# Patient Record
Sex: Female | Born: 1990
Health system: Southern US, Community
[De-identification: ages and names within clinical notes are randomized; demographics above are authoritative.]

## PROBLEM LIST (undated history)

## (undated) ENCOUNTER — Inpatient Hospital Stay (HOSPITAL_COMMUNITY): Payer: Self-pay

## (undated) DIAGNOSIS — F419 Anxiety disorder, unspecified: Secondary | ICD-10-CM

## (undated) HISTORY — PX: APPENDECTOMY: SHX54

---

## 2017-10-18 ENCOUNTER — Other Ambulatory Visit: Payer: Self-pay

## 2017-10-18 ENCOUNTER — Emergency Department (HOSPITAL_COMMUNITY)
Admission: EM | Admit: 2017-10-18 | Discharge: 2017-10-18 | Disposition: A | Discharge status: Home / Self Care | Payer: Medicaid Other | Attending: Emergency Medicine | Admitting: Emergency Medicine

## 2017-10-18 ENCOUNTER — Encounter (HOSPITAL_COMMUNITY): Payer: Self-pay | Admitting: Emergency Medicine

## 2017-10-18 ENCOUNTER — Emergency Department (HOSPITAL_COMMUNITY): Payer: Medicaid Other

## 2017-10-18 DIAGNOSIS — R102 Pelvic and perineal pain: Secondary | ICD-10-CM | POA: Diagnosis present

## 2017-10-18 DIAGNOSIS — O9989 Other specified diseases and conditions complicating pregnancy, childbirth and the puerperium: Secondary | ICD-10-CM | POA: Insufficient documentation

## 2017-10-18 DIAGNOSIS — Z3401 Encounter for supervision of normal first pregnancy, first trimester: Secondary | ICD-10-CM | POA: Diagnosis not present

## 2017-10-18 DIAGNOSIS — R109 Unspecified abdominal pain: Secondary | ICD-10-CM

## 2017-10-18 DIAGNOSIS — Z3A01 Less than 8 weeks gestation of pregnancy: Secondary | ICD-10-CM | POA: Diagnosis not present

## 2017-10-18 DIAGNOSIS — F17200 Nicotine dependence, unspecified, uncomplicated: Secondary | ICD-10-CM | POA: Diagnosis not present

## 2017-10-18 DIAGNOSIS — O99331 Smoking (tobacco) complicating pregnancy, first trimester: Secondary | ICD-10-CM | POA: Diagnosis not present

## 2017-10-18 HISTORY — DX: Anxiety disorder, unspecified: F41.9

## 2017-10-18 LAB — URINALYSIS, ROUTINE W REFLEX MICROSCOPIC
BILIRUBIN URINE: NEGATIVE
Glucose, UA: NEGATIVE mg/dL
Hgb urine dipstick: POSITIVE — AB
Ketones, ur: NEGATIVE mg/dL
LEUKOCYTES UA: NEGATIVE
Nitrite: NEGATIVE
PH: 6 (ref 5.0–8.0)
Protein, ur: NEGATIVE mg/dL
SPECIFIC GRAVITY, URINE: 1.018 (ref 1.005–1.030)

## 2017-10-18 LAB — CBC WITH DIFFERENTIAL/PLATELET
Basophils Absolute: 0 10*3/uL (ref 0.0–0.1)
Basophils Relative: 0 %
Eosinophils Absolute: 0.1 10*3/uL (ref 0.0–0.7)
Eosinophils Relative: 0 %
HCT: 36.5 % (ref 36.0–46.0)
Hemoglobin: 12.1 g/dL (ref 12.0–15.0)
Lymphocytes Relative: 27 %
Lymphs Abs: 3.3 10*3/uL (ref 0.7–4.0)
MCH: 28.7 pg (ref 26.0–34.0)
MCHC: 33.2 g/dL (ref 30.0–36.0)
MCV: 86.5 fL (ref 78.0–100.0)
Monocytes Absolute: 0.8 10*3/uL (ref 0.1–1.0)
Monocytes Relative: 7 %
Neutro Abs: 8.2 10*3/uL — ABNORMAL HIGH (ref 1.7–7.7)
Neutrophils Relative %: 66 %
Platelets: 388 10*3/uL (ref 150–400)
RBC: 4.22 MIL/uL (ref 3.87–5.11)
RDW: 14.3 % (ref 11.5–15.5)
WBC: 12.4 10*3/uL — ABNORMAL HIGH (ref 4.0–10.5)

## 2017-10-18 LAB — WET PREP, GENITAL
SPERM: NONE SEEN
TRICH WET PREP: NONE SEEN
YEAST WET PREP: NONE SEEN

## 2017-10-18 LAB — BASIC METABOLIC PANEL WITH GFR
Anion gap: 8 (ref 5–15)
BUN: 13 mg/dL (ref 6–20)
CO2: 21 mmol/L — ABNORMAL LOW (ref 22–32)
Calcium: 9.2 mg/dL (ref 8.9–10.3)
Chloride: 109 mmol/L (ref 98–111)
Creatinine, Ser: 0.58 mg/dL (ref 0.44–1.00)
GFR calc Af Amer: >60 mL/min
GFR calc non Af Amer: >60 mL/min
Glucose, Bld: 111 mg/dL — ABNORMAL HIGH (ref 70–99)
Potassium: 3.8 mmol/L (ref 3.5–5.1)
Sodium: 138 mmol/L (ref 135–145)

## 2017-10-18 LAB — ABO/RH: ABO/RH(D): A POS

## 2017-10-18 LAB — HCG, QUANTITATIVE, PREGNANCY: hCG, Beta Chain, Quant, S: 992 m[IU]/mL — ABNORMAL HIGH

## 2017-10-18 LAB — PREGNANCY, URINE: Preg Test, Ur: POSITIVE — AB

## 2017-10-18 NOTE — ED Notes (Signed)
I had a lengthy discussion with her about her current situation. She has attempted to phone all shelters listed (given by our social-worker) to no avail. She does have a car; and she does have a job (at a [employer]). She asks me if there is any way we might provide her with a "place to get a nap and a shower?". She tells me she is to report to work at [employer]6pm today; and that she would "love to get a couple of hours of sleep before I have to go to work". Upon consultation with our C.N., Darl PikesSusan and TCU nurse, Gabriel Earingaquita, we determine that we do indeed have a place for her to get a shower and some rest. She is very thankful for our efforts.

## 2017-10-18 NOTE — ED Provider Notes (Addendum)
8:40 AM-ultrasound return abnormal, negative for IUP, possible early pregnancy, without ectopic pregnancy excluded.  Quantitative pregnancy ordered, to assist with interpretation of ultrasound results.  Case was discussed with Dr. Eudelia Bunchardama prior to his departure, he feels that the pelvic examination did not indicate PID and there was no sign of bleeding to be concerned about spontaneous abortion.  At this time-patient is comfortable, denies abdominal pain, vomiting, weakness or dizziness.  She is hungry.  She states that she can go back to live with her boyfriend, and that they were arguing this morning, prior to her arriving here.  [place], "to be with my mother."  She states that her last menstrual cycle was 09/12/2017.   Clinical Course as of Oct 23 1139  Sat Oct 18, 2017  0848 Clue cells with increased WBC, abnormal  Wet prep, genital(!) [EW]  [phone] Normal except hemoglobin present  Urinalysis, Routine w reflex microscopic(!) [EW]  0957 Elevated HCG, 3-4 week range of gestational age possible, cannot rule out decreasing on a single value.  hCG, quantitative, pregnancy(!) [EW]    Clinical Course User Index [EW] Mancel BaleWentz, Mercedes Cabeza, MD   [place] Ob Comp < 14 Wks  Result Date: 10/18/2017 CLINICAL DATA:  Pelvic cramping. Positive pregnancy test. Gestational age by last menstrual period 5 weeks 0 days. EXAM: OBSTETRIC <14 WK ULTRASOUND TECHNIQUE: Transabdominal ultrasound was performed for evaluation of the gestation as well as the maternal uterus and adnexal regions. COMPARISON:  None. FINDINGS: Intrauterine gestational sac: None Yolk sac:  Not Visualized. Embryo:  Not Visualized. Maternal uterus/adnexae: The uterus measures 8.7 x 4.4 x 6.0 cm. The endometrium is thickened to 2.4 cm. The right ovary measures 4.6 x 4.3 x 2.6 cm. Hypoechoic circumscribed mass on the right ovary measures 1.6 by 2.1 x 1.6 cm. The left ovary measures 4.7 x 4.3 x 2.3 cm. Hypoechoic  circumscribed mass on the left ovary measures 1.8 x 2.0 x 2.0 cm. Trace amount of free fluid in the left adnexa. IMPRESSION: No intrauterine pregnancy seen, however the endometrium is thickened to 2.4 cm. Recommend follow-up quantitative B-HCG levels and follow-up US in 14 days if clinically indicated. Circumscribed hypoechoic masses seen on both ovaries may represent physiologic corpus luteal cyst. However given positive pregnancy test, the thickened endometrium and nonvisualization of IUP, extrauterine pregnancy has not been excluded. Attention on follow-up is recommended. Electronically Signed   By: Ted Mcalpineobrinka  Dimitrova M.D.   On: 10/18/2017 08:30    No data found.  11:31 AM Reevaluation with update and discussion. After initial assessment and treatment, an updated evaluation reveals she remains comfortable has no further complaints.  Findings discussed with the patient and all questions were answered. Mancel BaleElliott Jetty Berland   Medical Decision Making: Early first trimester pregnancy likely 3 to 4 weeks, based on both dates and hCG.  Ultrasound does not show any specific concerning factors however she will require close follow-up.  Patient is not having vaginal bleeding, and appears hemodynamically stable.  No indication for hospitalization or further intervention at this time.  CRITICAL CARE-no Performed by: Mancel BaleElliott Micholas Drumwright   Nursing Notes Reviewed/ Care Coordinated Applicable Imaging Reviewed Interpretation of Laboratory Data incorporated into ED treatment  The patient appears reasonably screened and/or stabilized for discharge and I doubt any other medical condition or other [facility] requiring further screening, evaluation, or treatment in the ED at this time prior to discharge.  Plan: Home Medications-OTC analgesia as needed; Home Treatments-pelvic rest; return here if the  recommended treatment, does not improve the symptoms; Recommended follow up-follow-up for repeat hCG in 48 hours at the [facility]  maternity admissions unit          Mancel Bale, MD 10/18/17 1222    Mancel Bale, MD 10/22/17 1141

## 2017-10-18 NOTE — Discharge Instructions (Addendum)
[facility] on Monday for further testing and treatment.  If you have other problems before then, go there immediately.  They are available 24 hours a day.  No sexual intercourse, until checked again.  Use Tylenol as needed for pain.

## 2017-10-18 NOTE — ED Notes (Signed)
Our Social Worker has just entered her room. She had been heard to shout (we could hear her through her closed door) "so you're gonna put me out on the street?! I'm pregnant!".

## 2017-10-18 NOTE — ED Notes (Signed)
EDP at bedside  

## 2017-10-18 NOTE — ED Notes (Signed)
As I write this, pt. Has just ambulated without difficulty to b.r. And back and is undergoing u/s.

## 2017-10-18 NOTE — Progress Notes (Signed)
CSW met with patient to provide resource lists for housing assistance, food assistance, rent assistance, and other emergency assistance referrals.  Patient was tearful and asked for resources to be left to review at a later time, stating, "I'm upset right now and I do not want to lash out at you." CSW informed patient that she may request CSW contact if she has additional questions regarding resource information.    Stephanie Acre, [place] Social Worker [phone]

## 2017-10-18 NOTE — Progress Notes (Signed)
CSW met with patient to follow up with social work consult.  Patient states she relocated to [place] from [place] where she has resided with her boyfriend. Patient states she and her boyfriend had an argument yesterday and she believes she can return to their shared residence following her discharge from the hospital.   Patient states she has a job and a car but she is feeling overwhelmed not knowing whether or not she is pregnant. Patient requested assistance with food, gas vouchers, "anything."   Social worker will provide patient with a list of resources.   Signing off. Please reconsult if additional social work needs arise.  Stephanie Acre, [place] Social Worker [phone]

## 2017-10-18 NOTE — ED Provider Notes (Signed)
[facility]-EMERGENCY DEPT Provider Note  CSN: [id number] Arrival date & time: 10/18/17 0413  Chief Complaint(s) Vaginal Pain; Possible Pregnancy; and Anxiety  HPI Mercedes Ayala is a 27 y.o. female G1p0010  The history is provided by the patient.  Abdominal Cramping  This is a new problem. Episode onset: 5 days. Episode frequency: intermittent. Progression since onset: fluctuating. Pertinent negatives include no chest pain and no shortness of breath.   Reports unprotected sex with partner of 2 yrs. Reports 1 day of vaginal discharge.   Patient reports that she is 8 days late for her menstrual cycle.  Last menstrual period was June 22.   Endorses STI 2 months ago, and both being treated.  Also reports having anxiety.   Past Medical History Past Medical History:  Diagnosis Date  . Anxiety    There are no active problems to display for this patient.  Home Medication(s) Prior to Admission medications   Not on File                                                                                                                                    Past Surgical History History reviewed. No pertinent surgical history. Family History History reviewed. No pertinent family history.  Social History Social History   Tobacco Use  . Smoking status: Current Every Day Smoker  . Smokeless tobacco: Never Used  Substance Use Topics  . Alcohol use: Not Currently  . Drug use: Not Currently   Allergies Patient has no known allergies.  Review of Systems Review of Systems  Respiratory: Negative for shortness of breath.   Cardiovascular: Negative for chest pain.   All other systems are reviewed and are negative for acute change except as noted in the HPI  Physical Exam Vital Signs  I have reviewed the triage vital signs BP (!) 152/96 (BP Location: Left Arm)   Pulse (!) 111   Temp 98.5 F (36.9 C) (Oral)   Resp 18   Ht 5\' 5"  (1.651 m)   Wt 102.5 kg (226  lb)   SpO2 99%   BMI 37.61 kg/m   Physical Exam  Constitutional: She is oriented to person, place, and time. She appears well-developed and well-nourished. No distress.  HENT:  Head: Normocephalic and atraumatic.  Right Ear: External ear normal.  Left Ear: External ear normal.  Nose: Nose normal.  Eyes: Conjunctivae and EOM are normal. No scleral icterus.  Neck: Normal range of motion and phonation normal.  Cardiovascular: Normal rate and regular rhythm.  Pulmonary/Chest: Effort normal. No stridor. No respiratory distress.  Abdominal: She exhibits no distension. There is no tenderness.  Genitourinary: Pelvic exam was performed with patient supine. There is no lesion on the right labia. There is no lesion on the left labia. Uterus is not enlarged and not tender. Cervix exhibits no motion tenderness, no discharge and no friability. Right adnexum displays no mass and no  tenderness. Left adnexum displays no mass and no tenderness. Vaginal discharge (mild white discharge coating wall) found.  Musculoskeletal: Normal range of motion. She exhibits no edema.  Neurological: She is alert and oriented to person, place, and time.  Skin: She is not diaphoretic.  Psychiatric: She has a normal mood and affect. Her behavior is normal.  Vitals reviewed.   ED Results and Treatments Labs (all labs ordered are listed, but only abnormal results are displayed) Labs Reviewed  WET PREP, GENITAL - Abnormal; Notable for the following components:      Result Value   Clue Cells Wet Prep HPF POC PRESENT (*)    WBC, Wet Prep HPF POC MANY (*)    All other components within normal limits  URINALYSIS, ROUTINE W REFLEX MICROSCOPIC - Abnormal; Notable for the following components:   Hgb urine dipstick POSITIVE (*)    All other components within normal limits  PREGNANCY, URINE - Abnormal; Notable for the following components:   Preg Test, Ur POSITIVE (*)    All other components within normal limits  CBC WITH  DIFFERENTIAL/PLATELET  BASIC METABOLIC PANEL  ABO/RH  GC/CHLAMYDIA PROBE AMP ([place]) NOT AT [facility]                                                                                                                         EKG  EKG Interpretation  Date/Time:    Ventricular Rate:    PR Interval:    QRS Duration:   QT Interval:    QTC Calculation:   R Axis:     Text Interpretation:        Radiology No results found. Pertinent labs & imaging results that were available during my care of the patient were reviewed by me and considered in my medical decision making (see chart for details).  Medications Ordered in ED Medications - No data to display                                                                                                                                  Procedures Procedures  (including critical care time)  Medical Decision Making / ED Course I have reviewed the nursing notes for this encounter and the patient's prior records (if available in EHR or on provided paperwork).    Intermittent pelvic cramping.  Pelvic exam without evidence of cervicitis or PID.  GC/chlamydia sent.  Wet prep  negative for trichomonas.  Positive for clue cells.  UPT positive.  UA negative for infection.  Rh pending. [place] to rule out ectopic pending  Patient care turned over to Dr Effie Shy at 0800. Patient case and results discussed in detail; please see their note for further ED managment.     This chart was dictated using voice recognition software.  Despite best efforts to proofread,  errors can occur which can change the documentation meaning.   Nira Conn, MD 10/18/17 [phone]
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
## 2017-10-20 ENCOUNTER — Ambulatory Visit (INDEPENDENT_AMBULATORY_CARE_PROVIDER_SITE_OTHER): Payer: Self-pay | Admitting: General Practice

## 2017-10-20 ENCOUNTER — Encounter: Payer: Self-pay | Admitting: Family Medicine

## 2017-10-20 DIAGNOSIS — O283 Abnormal ultrasonic finding on antenatal screening of mother: Secondary | ICD-10-CM

## 2017-10-20 DIAGNOSIS — O3680X0 Pregnancy with inconclusive fetal viability, not applicable or unspecified: Secondary | ICD-10-CM

## 2017-10-20 LAB — HCG, QUANTITATIVE, PREGNANCY: hCG, Beta Chain, Quant, S: 3513 m[IU]/mL — ABNORMAL HIGH (ref <5)

## 2017-10-20 LAB — GC/CHLAMYDIA PROBE AMP (~~LOC~~) NOT AT ARMC
CHLAMYDIA, DNA PROBE: NEGATIVE
NEISSERIA GONORRHEA: NEGATIVE

## 2017-10-20 NOTE — Progress Notes (Addendum)
Patient presents to office today for stat bhcg. Patient reports improvement in pain and denies bleeding. Discussed with patient we are monitoring her bhcg levels today & explained process to patient. Asked she wait in lobby for results/updated plan of care. Patient verbalized understanding & had no questions at this time.  Reviewed results with Dr Erin FullingHarraway-Smith who finds appropriate rise in bhcg levels, patient should have follow up ultrasound in 1 week. Scheduled for 8/5 @ 9am.   Informed patient of results, ultrasound appt, & reviewed ectopic precautions. Patient verbalized understanding to all & had no questions at this time.  Attestation of Attending Supervision of RN: Evaluation and management procedures were performed by the nurse under my supervision and collaboration.  I have reviewed the nursing note and chart, and I agree with the management and plan.  Carolyn L. Harraway-Smith, M.D., [facility] Core[facility]AC[facility]

## 2017-10-27 ENCOUNTER — Ambulatory Visit (INDEPENDENT_AMBULATORY_CARE_PROVIDER_SITE_OTHER): Payer: Self-pay | Admitting: *Deleted

## 2017-10-27 ENCOUNTER — Ambulatory Visit (HOSPITAL_COMMUNITY)
Admission: RE | Admit: 2017-10-27 | Discharge: 2017-10-27 | Disposition: A | Discharge status: Home / Self Care | Payer: BLUE CROSS/BLUE SHIELD | Source: Ambulatory Visit | Attending: Obstetrics & Gynecology | Admitting: Obstetrics & Gynecology

## 2017-10-27 DIAGNOSIS — O283 Abnormal ultrasonic finding on antenatal screening of mother: Secondary | ICD-10-CM | POA: Diagnosis not present

## 2017-10-27 DIAGNOSIS — O3680X0 Pregnancy with inconclusive fetal viability, not applicable or unspecified: Secondary | ICD-10-CM

## 2017-10-27 DIAGNOSIS — Z3A01 Less than 8 weeks gestation of pregnancy: Secondary | ICD-10-CM | POA: Insufficient documentation

## 2017-10-27 NOTE — Progress Notes (Signed)
Here for results of US. Reviewed results with Dr. Earlene Plateravis. [name] denies vaginal bleeding or pain. Informed her us indicates may not be normal pregnancy; suspicious for early miscarriage. Advised US in 10 days and if severe pain or heavy bleeding to go to MAU. Support given, patient teary. Explained to come to our office again for results after US. She voices understanding.

## 2017-10-27 NOTE — Progress Notes (Signed)
I have reviewed this chart and agree with the RN/CMA assessment and management.    K. Meryl Davis, M.D. [facility]  

## 2017-11-06 ENCOUNTER — Ambulatory Visit (HOSPITAL_COMMUNITY)
Admission: RE | Admit: 2017-11-06 | Discharge: 2017-11-06 | Disposition: A | Discharge status: Home / Self Care | Payer: Medicaid Other | Source: Ambulatory Visit | Attending: Obstetrics and Gynecology | Admitting: Obstetrics and Gynecology

## 2017-11-06 ENCOUNTER — Ambulatory Visit (INDEPENDENT_AMBULATORY_CARE_PROVIDER_SITE_OTHER): Payer: Medicaid Other

## 2017-11-06 ENCOUNTER — Encounter (HOSPITAL_COMMUNITY): Payer: Self-pay

## 2017-11-06 DIAGNOSIS — O3680X0 Pregnancy with inconclusive fetal viability, not applicable or unspecified: Secondary | ICD-10-CM

## 2017-11-06 DIAGNOSIS — Z3A01 Less than 8 weeks gestation of pregnancy: Secondary | ICD-10-CM | POA: Diagnosis not present

## 2017-11-06 NOTE — Progress Notes (Signed)
Pt came for US results, had provider to review US,advised pt every thing looks good.Advised to start Prenatal Vitamins asap, pt wanted to know will she need to comeback here for care.Advised check with front desk once check out to see if we are accepting new pt s if not they can refer her, Pt verbalized understanding, gave due date of 06/26/18 & GA [id number] as of today.

## 2017-11-07 NOTE — Progress Notes (Signed)
I have reviewed the chart and agree with nursing staff's documentation of this patient's encounter.  Beaumont Bingharlie Bridger Pizzi, MD 11/07/2017 9:36 PM

## 2017-11-27 ENCOUNTER — Encounter: Payer: Self-pay | Admitting: Student

## 2017-11-27 ENCOUNTER — Other Ambulatory Visit (HOSPITAL_COMMUNITY)
Admission: RE | Admit: 2017-11-27 | Discharge: 2017-11-27 | Disposition: A | Discharge status: Home / Self Care | Payer: Medicaid Other | Source: Ambulatory Visit | Attending: Student | Admitting: Student

## 2017-11-27 ENCOUNTER — Ambulatory Visit (INDEPENDENT_AMBULATORY_CARE_PROVIDER_SITE_OTHER): Payer: Medicaid Other | Admitting: Student

## 2017-11-27 ENCOUNTER — Ambulatory Visit: Payer: Medicaid Other | Admitting: Clinical

## 2017-11-27 VITALS — BP 125/54 | HR 83 | Wt 226.7 lb

## 2017-11-27 DIAGNOSIS — Z3A10 10 weeks gestation of pregnancy: Secondary | ICD-10-CM | POA: Diagnosis not present

## 2017-11-27 DIAGNOSIS — Z3400 Encounter for supervision of normal first pregnancy, unspecified trimester: Secondary | ICD-10-CM

## 2017-11-27 DIAGNOSIS — Z3481 Encounter for supervision of other normal pregnancy, first trimester: Secondary | ICD-10-CM | POA: Insufficient documentation

## 2017-11-27 MED ORDER — TERCONAZOLE 0.4 % VA CREA
1.0000 | TOPICAL_CREAM | Freq: Every day | VAGINAL | 0 refills | Status: DC
Start: 2017-11-27 — End: 2017-12-29

## 2017-11-27 MED FILL — TERCONAZOLE 0.4% VAG CREAM: 0.4 | 7 days supply | Qty: 45 | Fill #0

## 2017-11-27 NOTE — BH Specialist Note (Signed)
Integrated Behavioral Health Initial Visit  MRN: [id number] Name: Mercedes Ayala  Number of Integrated Behavioral Health Clinician visits:: 1/6 Session Start time: 11:20 Session End time: 11:26 Total time: 6 minutes  Type of Service: Integrated Behavioral Health- Individual/Family Interpretor:No. Interpretor Name and Language: n/a   Warm Hand Off Completed.       SUBJECTIVE: Mercedes Ayala is a 27 y.o. female accompanied by n/a Patient was referred by Luna Kitchens, CNM  for Initial OB introduction to integrated behavioral health services . Patient reports the following symptoms/concerns: Pt states no particular concerns today Duration of problem: n/a; Severity of problem: n/a  OBJECTIVE: Mood: Normal and Affect: Appropriate Risk of harm to self or others: No plan to harm self or others  LIFE CONTEXT: Family and Social: - School/Work: - Self-Care: - Life Changes: Current pregnancy   INTERVENTIONS: Standardized Assessments completed: Patient declined screening  ASSESSMENT: Patient currently experiencing Supervision of normal pregnancy, antepartum   Patient may benefit from Initial OB introduction to integrated behavioral health services .  PLAN: 1. Follow up with behavioral health clinician on : As needed 2. Behavioral recommendations: - 3. Referral(s): Integrated [employer] (In Clinic) 4. "From scale of 1-10, how likely are you to follow plan?": -  Rae Lips, LCSW

## 2017-11-27 NOTE — Addendum Note (Signed)
Addended by: Chrystie Nose on: 11/27/2017 02:07 PM   Modules accepted: Level of Service

## 2017-11-27 NOTE — Progress Notes (Addendum)
MNA Subjective:    Mercedes Ayala is being seen today for her first obstetrical visit.  This is not a planned pregnancy. She is at [redacted]w[redacted]d gestation. Her obstetrical history is significant for appendectomy. Relationship with FOB: significant other, not living together. Patient does intend to breast feed. Pregnancy history fully reviewed.  Patient reports fatigue. Throws up multiple times a day. She felt ok yesterday and today. She does not want any medicine for NV.   She started having some vaginal discomfort for a few weeks. She denies abnormal discharge; denies dysuria, burning with urination. She shaved her pubic hair and saw what looked like red spots on her vulva, but no blistering.   Review of Systems:   Review of Systems  Constitutional: Negative.   HENT: Negative.   Respiratory: Negative.   Cardiovascular: Negative.   Gastrointestinal: Negative.   Genitourinary: Negative.     Objective:     BP (!) 125/54   Pulse 83   Wt 226 lb 11.2 oz (102.8 kg)   LMP 09/12/2017   BMI 37.72 kg/m  Physical Exam  Constitutional: She appears well-developed.  HENT:  Head: Normocephalic.  Neck: Normal range of motion.  Respiratory: Effort normal.  GI: Soft.  Genitourinary: Vagina normal.  Musculoskeletal: Normal range of motion.  Neurological: She is alert.  Skin: Skin is warm and dry.  Psychiatric: She has a normal mood and affect.    Exam External labia, near the urethra, looks red and excoriated. No obvious blistering, no ulcerations that imply recent de-roofing of blisters. Sample of fluid not taken. Advised patient to keep a very close watch on her pain and discomfort and to call us if she needs to get checked for possible herpes. Patient verbalized understanding.  Internal vaginal walls are pink with clumpy white discharge.   Assessment:    Pregnancy: G2P0010 Patient Active Problem List   Diagnosis Date Noted  . Supervision of normal first pregnancy, antepartum 11/27/2017        Plan:     Initial labs drawn. Prenatal vitamins. Problem list reviewed and updated. AFP3 discussed: will order at next visit. Role of ultrasound in pregnancy discussed; fetal survey: ordered. Amniocentesis discussed: not indicated. Follow up in 4 weeks. 90% of 30 min visit spent on counseling and coordination of care.  Welcomed patient to practice; discussed role of teaching in practice.  -HgbA1c and Pap today; RX for terazole given presumptively.  -Patient will let us know if she wants medicine for morning sickness.   Charlesetta Garibaldi [facility] 11/27/2017

## 2017-11-27 NOTE — Patient Instructions (Signed)

## 2017-11-28 LAB — CYTOLOGY - PAP
CHLAMYDIA, DNA PROBE: NEGATIVE
DIAGNOSIS: NEGATIVE
NEISSERIA GONORRHEA: NEGATIVE

## 2017-11-29 LAB — URINE CULTURE, OB REFLEX

## 2017-11-29 LAB — CULTURE, OB URINE

## 2017-12-05 LAB — OBSTETRIC PANEL, INCLUDING HIV
Antibody Screen: NEGATIVE
Basophils Absolute: 0 10*3/uL (ref 0.0–0.2)
Basos: 0 %
EOS (ABSOLUTE): 0.1 10*3/uL (ref 0.0–0.4)
Eos: 1 %
HEP B S AG: NEGATIVE
HIV Screen 4th Generation wRfx: NONREACTIVE
Hematocrit: 36.9 % (ref 34.0–46.6)
Hemoglobin: 12.3 g/dL (ref 11.1–15.9)
IMMATURE GRANS (ABS): 0 10*3/uL (ref 0.0–0.1)
IMMATURE GRANULOCYTES: 0 %
LYMPHS: 22 %
Lymphocytes Absolute: 2.9 10*3/uL (ref 0.7–3.1)
MCH: 28.9 pg (ref 26.6–33.0)
MCHC: 33.3 g/dL (ref 31.5–35.7)
MCV: 87 fL (ref 79–97)
Monocytes Absolute: 0.8 10*3/uL (ref 0.1–0.9)
Monocytes: 6 %
Neutrophils Absolute: 9.5 10*3/uL — ABNORMAL HIGH (ref 1.4–7.0)
Neutrophils: 71 %
PLATELETS: 400 10*3/uL (ref 150–450)
RBC: 4.25 x10E6/uL (ref 3.77–5.28)
RDW: 14.8 % (ref 12.3–15.4)
RH TYPE: POSITIVE
RPR Ser Ql: NONREACTIVE
Rubella Antibodies, IGG: 5.71 index (ref >0.99)
WBC: 13.3 10*3/uL — ABNORMAL HIGH (ref 3.4–10.8)

## 2017-12-05 LAB — HEMOGLOBINOPATHY EVALUATION
Ferritin: 31 ng/mL (ref 15–150)
HGB A2 QUANT: 2.1 % (ref 1.8–3.2)
HGB A: 97.9 % (ref 96.4–98.8)
HGB S: 0 %
HGB SOLUBILITY: NEGATIVE
HGB VARIANT: 0 %
Hgb C: 0 %
Hgb F Quant: 0 % (ref 0.0–2.0)

## 2017-12-05 LAB — HEMOGLOBIN A1C
ESTIMATED AVERAGE GLUCOSE: 126 mg/dL
Hgb A1c MFr Bld: 6 % — ABNORMAL HIGH (ref 4.8–5.6)

## 2017-12-05 LAB — CYSTIC FIBROSIS GENE TEST

## 2017-12-09 ENCOUNTER — Other Ambulatory Visit: Payer: Self-pay | Admitting: *Deleted

## 2017-12-09 ENCOUNTER — Encounter: Payer: Self-pay | Admitting: *Deleted

## 2017-12-09 LAB — SMN1 COPY NUMBER ANALYSIS (SMA CARRIER SCREENING)

## 2017-12-09 MED ORDER — PRENATAL 27-0.8 MG PO TABS: 1.0000 | ORAL_TABLET | Freq: Every day | ORAL | 12 refills | Status: AC

## 2017-12-11 ENCOUNTER — Telehealth: Payer: Self-pay | Admitting: Student

## 2017-12-11 ENCOUNTER — Other Ambulatory Visit: Payer: Self-pay | Admitting: Student

## 2017-12-11 DIAGNOSIS — R8271 Bacteriuria: Secondary | ICD-10-CM | POA: Insufficient documentation

## 2017-12-11 MED ORDER — AMOXICILLIN-POT CLAVULANATE 875-125 MG PO TABS: 1.0000 | ORAL_TABLET | Freq: Two times a day (BID) | ORAL | 0 refills | Status: DC

## 2017-12-11 MED FILL — AMOX-CLAV 875-125 MG TABLET: 875-125 | 7 days supply | Qty: 14 | Fill #0

## 2017-12-11 NOTE — Telephone Encounter (Signed)
Called patient about her ASB results and medication at the pharmacy; VM not set up.

## 2017-12-11 NOTE — Telephone Encounter (Signed)
Tried to reach patient again about results; no answer and no VM. Will send MyChart message.

## 2017-12-24 ENCOUNTER — Telehealth: Payer: Self-pay | Admitting: Family Medicine

## 2017-12-24 NOTE — Telephone Encounter (Signed)
Called patient in regards to a message we received from the answering service about rescheduling her appt. VM has not setup so could not leave a message.

## 2017-12-25 ENCOUNTER — Encounter: Payer: Self-pay | Admitting: Obstetrics and Gynecology

## 2017-12-29 ENCOUNTER — Ambulatory Visit (INDEPENDENT_AMBULATORY_CARE_PROVIDER_SITE_OTHER): Payer: BLUE CROSS/BLUE SHIELD | Admitting: Advanced Practice Midwife

## 2017-12-29 DIAGNOSIS — Z23 Encounter for immunization: Secondary | ICD-10-CM

## 2017-12-29 DIAGNOSIS — Z3400 Encounter for supervision of normal first pregnancy, unspecified trimester: Secondary | ICD-10-CM

## 2017-12-29 NOTE — Progress Notes (Signed)
Pt states is feeling nauseated & has headache, has not taken anything.

## 2017-12-29 NOTE — Progress Notes (Signed)
   PRENATAL VISIT NOTE  Subjective:  Mercedes Ayala is a 27 y.o. G2P0010 at [redacted]w[redacted]d being seen today for ongoing prenatal care.  She is currently monitored for the following issues for this low-risk pregnancy and has Supervision of normal first pregnancy, antepartum and Asymptomatic bacteriuria on their problem list.  Patient reports no complaints.  Contractions: Not present. Vag. Bleeding: None.  Movement: Absent. Denies leaking of fluid.   The following portions of the patient's history were reviewed and updated as appropriate: allergies, current medications, past family history, past medical history, past social history, past surgical history and problem list. Problem list updated.  Objective:   Vitals:   12/29/17 1512  BP: 118/66  Pulse: 90  Weight: 224 lb 4.8 oz (101.7 kg)    Fetal Status: Fetal Heart Rate (bpm): 141   Movement: Absent     General:  Alert, oriented and cooperative. Patient is in no acute distress.  Skin: Skin is warm and dry. No rash noted.   Cardiovascular: Normal heart rate noted  Respiratory: Normal respiratory effort, no problems with respiration noted  Abdomen: Soft, gravid, appropriate for gestational age.  Pain/Pressure: Absent     Pelvic: Cervical exam deferred        Extremities: Normal range of motion.  Edema: None  Mental Status: Normal mood and affect. Normal behavior. Normal judgment and thought content.   Assessment and Plan:  Pregnancy: G2P0010 at [redacted]w[redacted]d  1. Supervision of normal first pregnancy, antepartum - Genetic Screening - AFP, Serum, Open Spina Bifida - Anatomy US scheduled  - Flu vax today  - Patient planning to move to [place] to be with family. Unsure of where she will get care at this time. Knows that she can send in records request and we can fax records to her new provider once she is settled.   Preterm labor symptoms and general obstetric precautions including but not limited to vaginal bleeding, contractions, leaking of fluid  and fetal movement were reviewed in detail with the patient. Please refer to After Visit Summary for other counseling recommendations.  Return in about 4 weeks (around 01/26/2018).  Future Appointments  Date Time Provider Department Center  01/28/2018 10:15 AM [facility] [place] 4 [facility]    Thressa Sheller, CNM

## 2017-12-29 NOTE — Patient Instructions (Signed)

## 2017-12-31 LAB — AFP, SERUM, OPEN SPINA BIFIDA
AFP MOM: 1.24
AFP Value: 29.2 ng/mL
Gest. Age on Collection Date: 15.3 weeks
MATERNAL AGE AT EDD: 29.4 a
OSBR Risk 1 IN: 5748
TEST RESULTS AFP: NEGATIVE
Weight: 226 [lb_av]

## 2018-01-01 ENCOUNTER — Encounter: Payer: Self-pay | Admitting: *Deleted

## 2018-01-05 ENCOUNTER — Telehealth: Payer: Self-pay | Admitting: Family Medicine

## 2018-01-05 ENCOUNTER — Other Ambulatory Visit: Payer: Self-pay

## 2018-01-05 ENCOUNTER — Encounter (HOSPITAL_COMMUNITY): Payer: Self-pay | Admitting: *Deleted

## 2018-01-05 ENCOUNTER — Inpatient Hospital Stay (HOSPITAL_COMMUNITY)
Admission: AD | Admit: 2018-01-05 | Discharge: 2018-01-05 | Disposition: A | Discharge status: Home / Self Care | Payer: BLUE CROSS/BLUE SHIELD | Source: Ambulatory Visit | Attending: Obstetrics and Gynecology | Admitting: Obstetrics and Gynecology

## 2018-01-05 DIAGNOSIS — R140 Abdominal distension (gaseous): Secondary | ICD-10-CM | POA: Diagnosis not present

## 2018-01-05 DIAGNOSIS — R51 Headache: Secondary | ICD-10-CM | POA: Insufficient documentation

## 2018-01-05 DIAGNOSIS — F419 Anxiety disorder, unspecified: Secondary | ICD-10-CM | POA: Diagnosis not present

## 2018-01-05 DIAGNOSIS — F17210 Nicotine dependence, cigarettes, uncomplicated: Secondary | ICD-10-CM | POA: Insufficient documentation

## 2018-01-05 DIAGNOSIS — O99342 Other mental disorders complicating pregnancy, second trimester: Secondary | ICD-10-CM | POA: Insufficient documentation

## 2018-01-05 DIAGNOSIS — O99332 Smoking (tobacco) complicating pregnancy, second trimester: Secondary | ICD-10-CM | POA: Diagnosis not present

## 2018-01-05 DIAGNOSIS — Z3A16 16 weeks gestation of pregnancy: Secondary | ICD-10-CM | POA: Insufficient documentation

## 2018-01-05 DIAGNOSIS — O26892 Other specified pregnancy related conditions, second trimester: Secondary | ICD-10-CM | POA: Diagnosis not present

## 2018-01-05 DIAGNOSIS — R109 Unspecified abdominal pain: Secondary | ICD-10-CM | POA: Diagnosis present

## 2018-01-05 DIAGNOSIS — Z3492 Encounter for supervision of normal pregnancy, unspecified, second trimester: Secondary | ICD-10-CM

## 2018-01-05 LAB — URINALYSIS, ROUTINE W REFLEX MICROSCOPIC
Bilirubin Urine: NEGATIVE
GLUCOSE, UA: NEGATIVE mg/dL
Hgb urine dipstick: NEGATIVE
Ketones, ur: NEGATIVE mg/dL
LEUKOCYTES UA: NEGATIVE
Nitrite: NEGATIVE
PROTEIN: NEGATIVE mg/dL
SPECIFIC GRAVITY, URINE: 1.019 (ref 1.005–1.030)
pH: 7 (ref 5.0–8.0)

## 2018-01-05 MED ORDER — ACETAMINOPHEN 500 MG PO TABS
1000.0000 mg | ORAL_TABLET | Freq: Once | ORAL | Status: AC
  Administered 2018-01-05: 1000 mg via ORAL
  Filled 2018-01-05: qty 2

## 2018-01-05 NOTE — Telephone Encounter (Signed)
Patient called in today, she stated that she is moving out of town and she will have a long Drive and she havent been feeling well and want to get the heart rate checked, after discussing this with my Team Lead she told me that the patient need to go MAU to be checked since she is not feeling well.

## 2018-01-05 NOTE — MAU Provider Note (Signed)
History     CSN: [id number]  Arrival date and time: 01/05/18 1540   None     Chief Complaint  Patient presents with  . Abdominal Pain   HPI Mercedes Ayala is 27 y.o. G2P0010 [redacted]w[redacted]d weeks presenting with abdominal bloating and pain that began last night.  A little cramping this am but now Sxs have resolved. She is a patient in the Clinic downstairs, moving to [place] in a few days.  Denies vaginal bleeding.  States she is very anxious by nature and is worried about this pregnancy because it is her first. Is having relationship problems with her boyfriend.     Past Medical History:  Diagnosis Date  . Anxiety     Past Surgical History:  Procedure Laterality Date  . APPENDECTOMY      Family History  Problem Relation Age of Onset  . Asthma Mother   . Diabetes Mother   . Asthma Father   . Hypertension Father   . Alcohol abuse Father     Social History   Tobacco Use  . Smoking status: Current Every Day Smoker    Packs/day: 0.50    Types: Cigarettes  . Smokeless tobacco: Never Used  Substance Use Topics  . Alcohol use: Not Currently  . Drug use: Not Currently    Allergies: No Known Allergies  Medications Prior to Admission  Medication Sig Dispense Refill Last Dose  . Prenatal Vit-Fe Fumarate-FA (MULTIVITAMIN-PRENATAL) 27-0.8 MG TABS tablet Take 1 tablet by mouth daily at 12 noon. 30 tablet 12 Taking    Review of Systems  Constitutional: Negative for appetite change (has eaten only a piece of steak today) and fatigue.  Respiratory: Negative for chest tightness.   Cardiovascular: Negative for chest pain.  Gastrointestinal: Positive for abdominal pain and nausea (associated with bloating). Negative for vomiting.       + for bloating that has resolved.   Genitourinary: Negative for dysuria, pelvic pain, vaginal bleeding and vaginal discharge.  Neurological: Positive for headaches.  Psychiatric/Behavioral: Negative for agitation. The patient is nervous/anxious.     Physical Exam   Blood pressure 125/75, pulse 94, temperature 97.7 F (36.5 C), resp. rate 16, height 5\' 4"  (1.626 m), weight 98.9 kg, last menstrual period 09/12/2017.  Physical Exam  Nursing note and vitals reviewed. Constitutional: She is oriented to person, place, and time. She appears well-developed and well-nourished. No distress.  HENT:  Head: Normocephalic.  Neck: Normal range of motion.  Cardiovascular: Normal rate.  Respiratory: Effort normal.  GI: Soft. She exhibits no distension and no mass. There is no tenderness. There is no rebound and no guarding.  Genitourinary: There is no rash, tenderness or lesion on the right labia. There is no rash, tenderness or lesion on the left labia. Uterus is enlarged (measures 16 weeks in size.). Uterus is not tender. Cervix exhibits no motion tenderness, no discharge and no friability. No erythema, tenderness or bleeding in the vagina. No vaginal discharge (small amount of white normal appearing discharge without odor) found.  Neurological: She is alert and oriented to person, place, and time.  Skin: Skin is warm and dry.  Psychiatric: She has a normal mood and affect. Her behavior is normal. Thought content normal.  Anxious appearing.   FETAL HEART RATE dopplered at 145.  MAU Course  Procedures  MDM MSE Exam Medication- Tylenol 1gm po given in MAU for headache.  17:55  Patient states her headache has resolved, rating it 0/10 at this time Will discharge to  home  Assessment and Plan  A;   Abdominal bloating with mild nausea--sxs resolved prior to admission       Second trimester pregnancy       Anxiety       Headache  P:  Patient is moving to [place] in 3 days and plans to continue prenatal care there.                May take Tylenol prn for headaches       Encouraged her to stay well hydrated    Dennison [place] 01/05/2018, 4:53 PM

## 2018-01-05 NOTE — MAU Note (Signed)
Pt presents to MAU with complaints of lower abdominal cramping since last night. Denies any VB or abnormal discharge

## 2018-01-05 NOTE — Discharge Instructions (Signed)
Abdominal Pain, Adult Many things can cause belly (abdominal) pain. Most times, belly pain is not dangerous. Many cases of belly pain can be watched and treated at home. Sometimes belly pain is serious, though. Your doctor will try to find the cause of your belly pain. Follow these instructions at home:  Take over-the-counter and prescription medicines only as told by your doctor. Do not take medicines that help you poop (laxatives) unless told to by your doctor.  Drink enough fluid to keep your pee (urine) clear or pale yellow.  Watch your belly pain for any changes.  Keep all follow-up visits as told by your doctor. This is important. Contact a doctor if:  Your belly pain changes or gets worse.  You are not hungry, or you lose weight without trying.  You are having trouble pooping (constipated) or have watery poop (diarrhea) for more than 2-3 days.  You have pain when you pee or poop.  Your belly pain wakes you up at night.  Your pain gets worse with meals, after eating, or with certain foods.  You are throwing up and cannot keep anything down.  You have a fever. Get help right away if:  Your pain does not go away as soon as your doctor says it should.  You cannot stop throwing up.  Your pain is only in areas of your belly, such as the right side or the left lower part of the belly.  You have bloody or black poop, or poop that looks like tar.  You have very bad pain, cramping, or bloating in your belly.  You have signs of not having enough fluid or water in your body (dehydration), such as: ? Dark pee, very little pee, or no pee. ? Cracked lips. ? Dry mouth. ? Sunken eyes. ? Sleepiness. ? Weakness. This information is not intended to replace advice given to you by your health care provider. Make sure you discuss any questions you have with your health care provider. Document Released: 08/28/2007 Document Revised: 09/29/2015 Document Reviewed: 08/23/2015 Elsevier  Interactive Patient Education  2018 [employer]. Second Trimester of Pregnancy The second trimester is from week 13 through week 28, month 4 through 6. This is often the time in pregnancy that you feel your best. Often times, morning sickness has lessened or quit. You may have more energy, and you may get hungry more often. Your unborn baby (fetus) is growing rapidly. At the end of the sixth month, he or she is about 9 inches long and weighs about 1 pounds. You will likely feel the baby move (quickening) between 18 and 20 weeks of pregnancy. Follow these instructions at home:  Avoid all smoking, herbs, and alcohol. Avoid drugs not approved by your doctor.  Do not use any tobacco products, including cigarettes, chewing tobacco, and electronic cigarettes. If you need help quitting, ask your doctor. You may get counseling or other support to help you quit.  Only take medicine as told by your doctor. Some medicines are safe and some are not during pregnancy.  Exercise only as told by your doctor. Stop exercising if you start having cramps.  Eat regular, healthy meals.  Wear a good support bra if your breasts are tender.  Do not use hot tubs, steam rooms, or saunas.  Wear your seat belt when driving.  Avoid raw meat, uncooked cheese, and liter boxes and soil used by cats.  Take your prenatal vitamins.  Take 1500-2000 milligrams of calcium daily starting at the  20th week of pregnancy until you deliver your baby.  Try taking medicine that helps you poop (stool softener) as needed, and if your doctor approves. Eat more fiber by eating fresh fruit, vegetables, and whole grains. Drink enough fluids to keep your pee (urine) clear or pale yellow.  Take warm water baths (sitz baths) to soothe pain or discomfort caused by hemorrhoids. Use hemorrhoid cream if your doctor approves.  If you have puffy, bulging veins (varicose veins), wear support hose. Raise (elevate) your feet for 15 minutes, 3-4  times a day. Limit salt in your diet.  Avoid heavy lifting, wear low heals, and sit up straight.  Rest with your legs raised if you have leg cramps or low back pain.  Visit your dentist if you have not gone during your pregnancy. Use a soft toothbrush to brush your teeth. Be gentle when you floss.  You can have sex (intercourse) unless your doctor tells you not to.  Go to your doctor visits. Get help if:  You feel dizzy.  You have mild cramps or pressure in your lower belly (abdomen).  You have a nagging pain in your belly area.  You continue to feel sick to your stomach (nauseous), throw up (vomit), or have watery poop (diarrhea).  You have bad smelling fluid coming from your vagina.  You have pain with peeing (urination). Get help right away if:  You have a fever.  You are leaking fluid from your vagina.  You have spotting or bleeding from your vagina.  You have severe belly cramping or pain.  You lose or gain weight rapidly.  You have trouble catching your breath and have chest pain.  You notice sudden or extreme puffiness (swelling) of your face, hands, ankles, feet, or legs.  You have not felt the baby move in over an hour.  You have severe headaches that do not go away with medicine.  You have vision changes. This information is not intended to replace advice given to you by your health care provider. Make sure you discuss any questions you have with your health care provider. Document Released: 06/05/2009 Document Revised: 08/17/2015 Document Reviewed: 05/12/2012 Elsevier Interactive Patient Education  2017 [employer].

## 2018-01-06 ENCOUNTER — Encounter: Payer: Self-pay | Admitting: *Deleted

## 2018-01-28 ENCOUNTER — Ambulatory Visit (HOSPITAL_COMMUNITY): Payer: Medicaid Other

## 2018-09-11 ENCOUNTER — Encounter (HOSPITAL_COMMUNITY): Payer: Self-pay

## 2018-12-03 IMAGING — US US OB COMP LESS 14 WK
1 series · 13 of 28 positions shown · non-contrast
Comparison: None.

CLINICAL DATA: Pelvic cramping. Positive pregnancy test.
Gestational age by last menstrual period 5 weeks 0 days.

EXAM:
OBSTETRIC <14 WK ULTRASOUND
TECHNIQUE: Transabdominal ultrasound was performed for evaluation of the
gestation as well as the maternal uterus and adnexal regions.

[Series 1: us ob comp less 14 wk · 0.11mm/px · 13 of 94 slices shown]
[im 4/94]
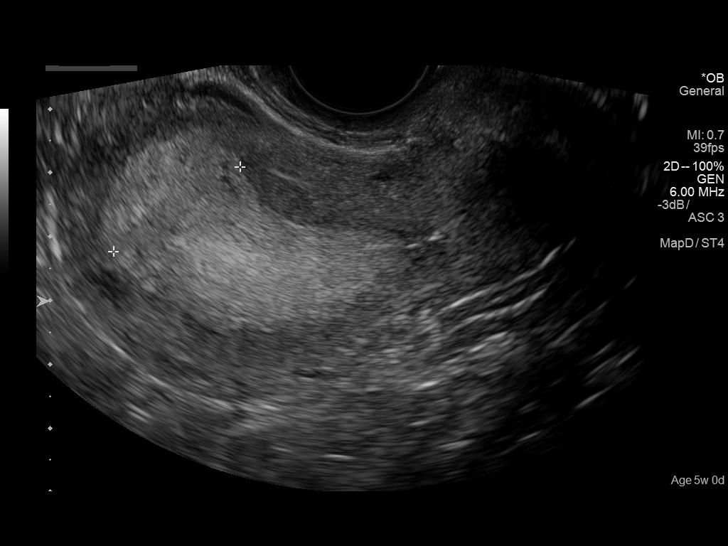
[im 11/94]
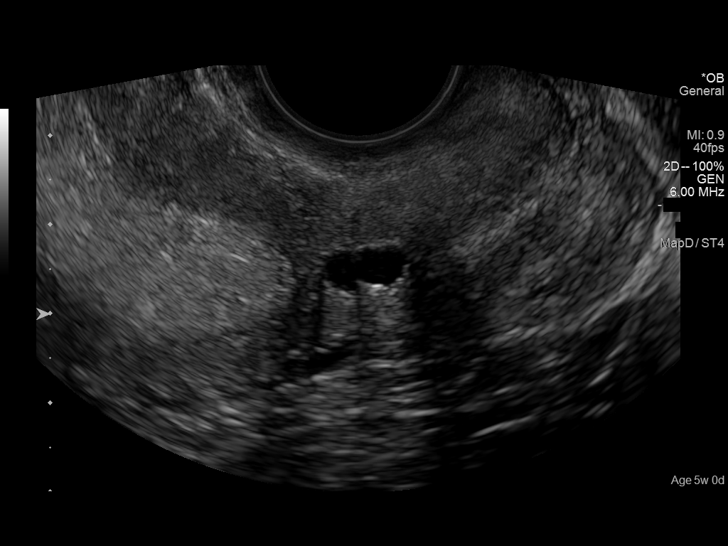
[im 18/94]
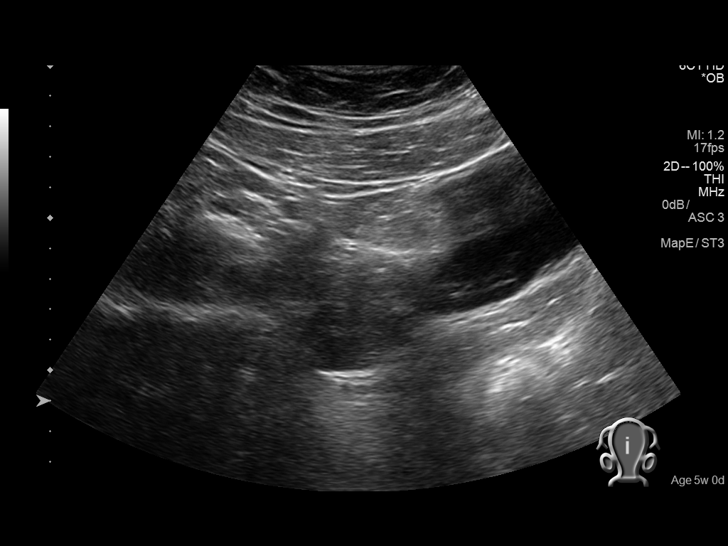
[im 25/94]
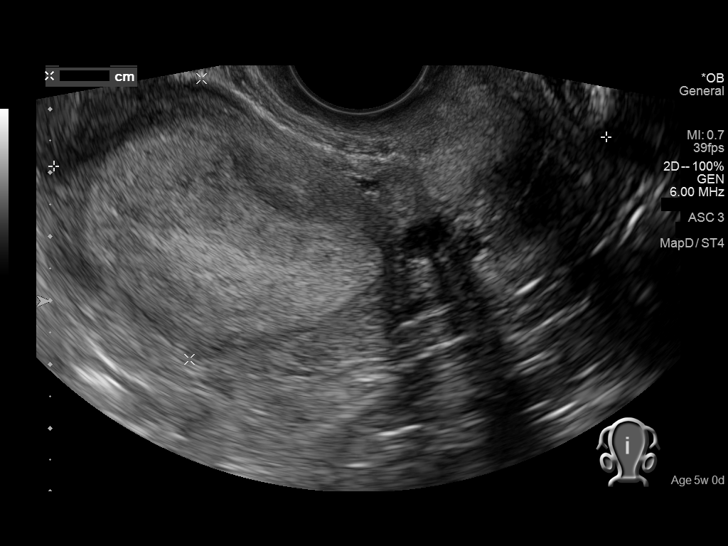
[im 32/94]
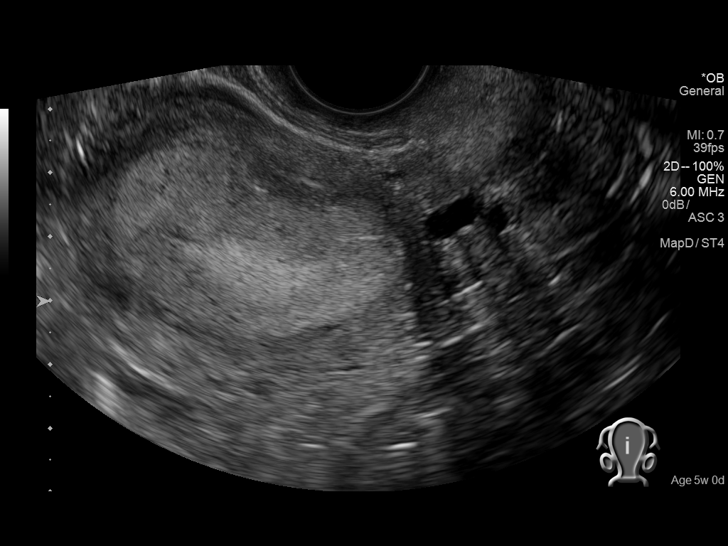
[im 38/94]
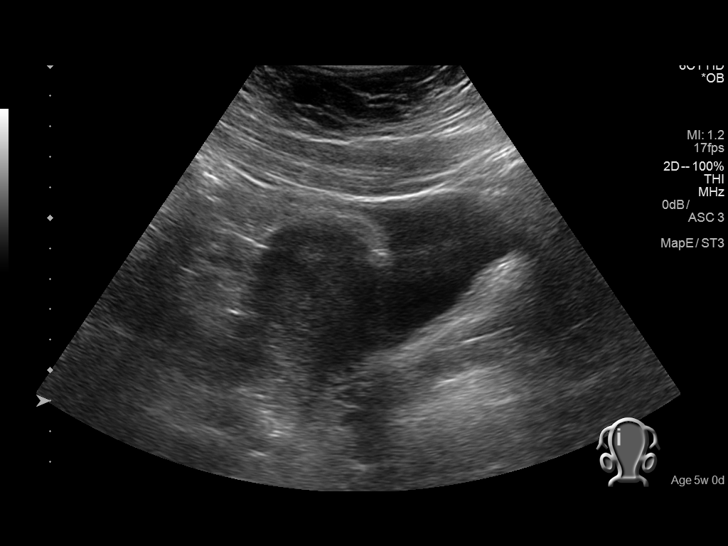
[im 49/94]
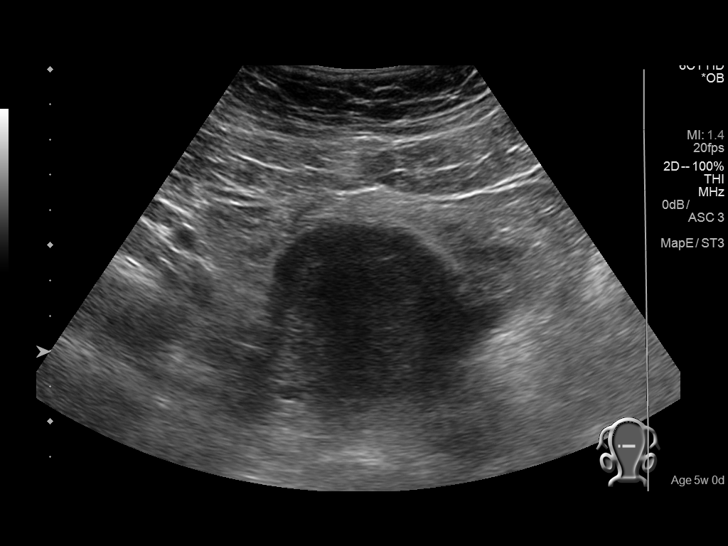
[im 56/94]
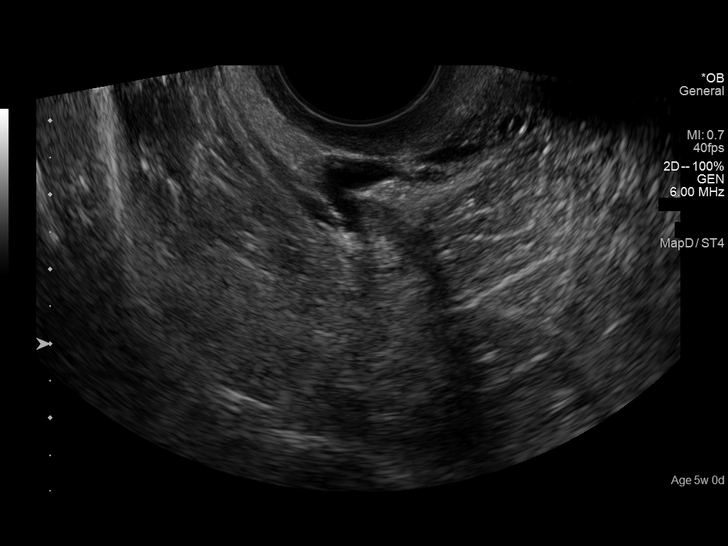
[im 63/94]
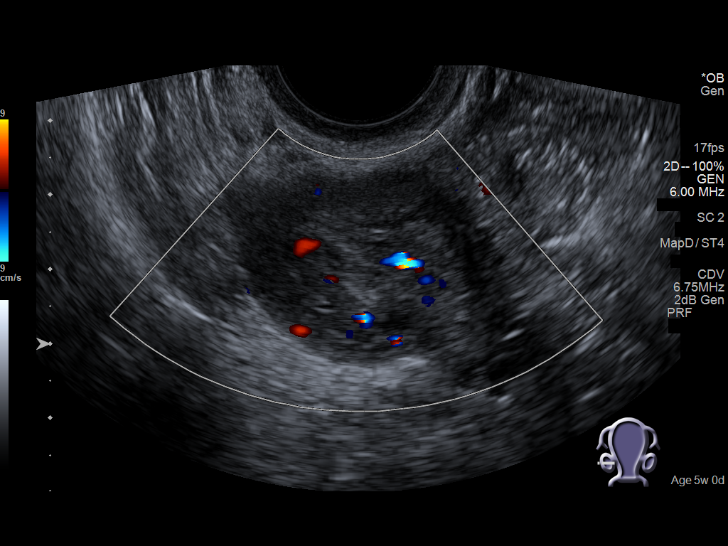
[im 69/94]
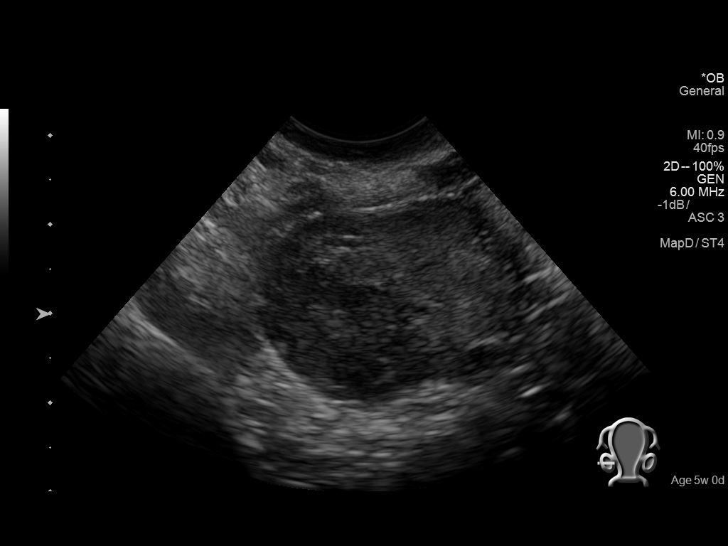
[im 76/94]
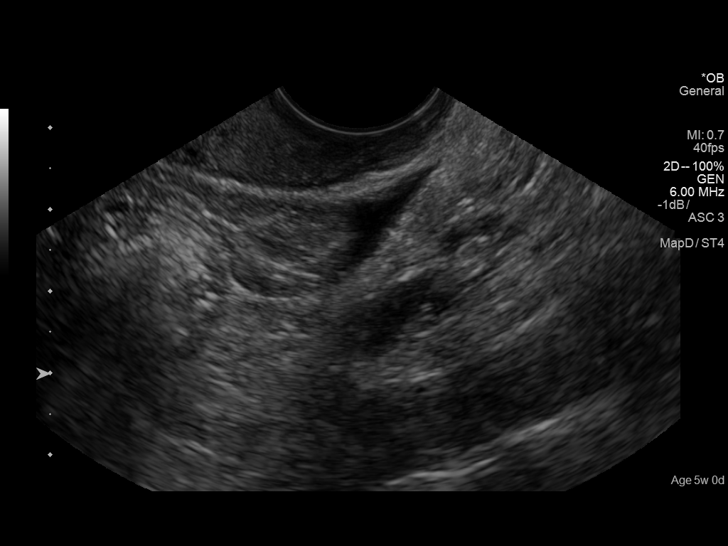
[im 83/94]
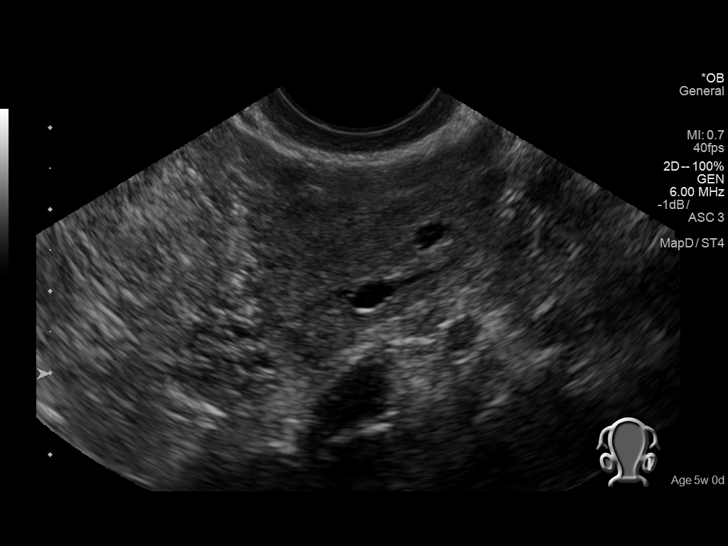
[im 90/94]
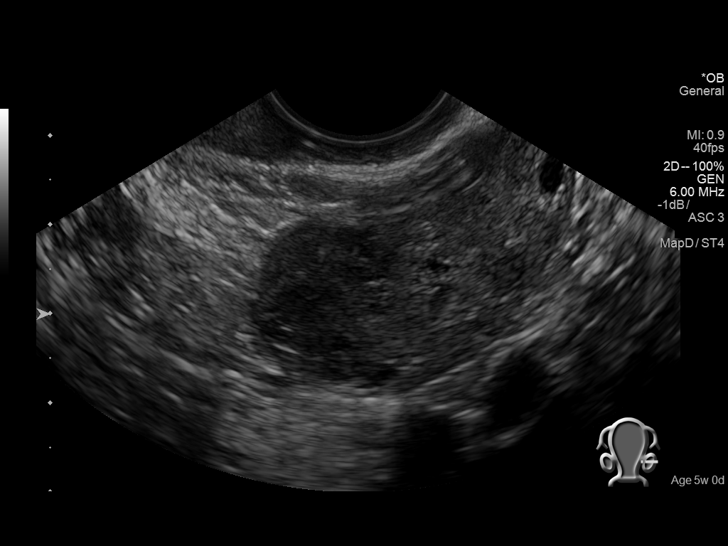

[13 of 28 positions shown; findings below may reference images not displayed]

FINDINGS: Intrauterine gestational sac: None

Yolk sac:  Not Visualized.

Embryo:  Not Visualized.

Maternal uterus/adnexae: The uterus measures 8.7 x 4.4 x 6.0 cm. The
endometrium is thickened to 2.4 cm.

The right ovary measures 4.6 x 4.3 x 2.6 cm. Hypoechoic
circumscribed mass on the right ovary measures 1.6 by 2.1 x 1.6 cm.

The left ovary measures 4.7 x 4.3 x 2.3 cm. Hypoechoic circumscribed
mass on the left ovary measures 1.8 x 2.0 x 2.0 cm.

Trace amount of free fluid in the left adnexa.
IMPRESSION: No intrauterine pregnancy seen, however the endometrium is thickened
to 2.4 cm. Recommend follow-up quantitative B-HCG levels and
follow-up US in 14 days if clinically indicated.

Circumscribed hypoechoic masses seen on both ovaries may represent
physiologic corpus luteal cyst. However given positive pregnancy
test, the thickened endometrium and nonvisualization of IUP,
extrauterine pregnancy has not been excluded.

Attention on follow-up is recommended.

## 2020-01-16 IMAGING — US US OB TRANSVAGINAL
1 series · 15 of 28 positions shown · non-contrast
Comparison: 10/18/2017

CLINICAL DATA: Pregnancy of unknown anatomic location

EXAM:
TRANSVAGINAL OB ULTRASOUND
TECHNIQUE: Transvaginal ultrasound was performed for complete evaluation of the
gestation as well as the maternal uterus, adnexal regions, and
pelvic cul-de-sac.

[Series 1: us ob transvaginal · 15 of 35 slices shown]
[im 1/35]
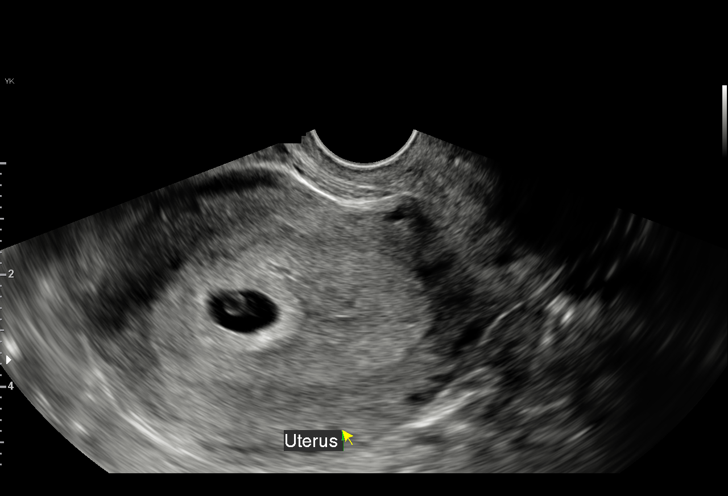
[im 3/35]
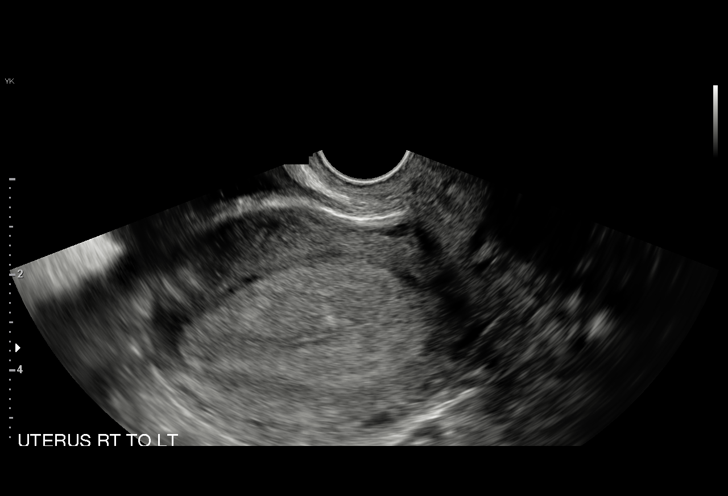
[im 6/35]
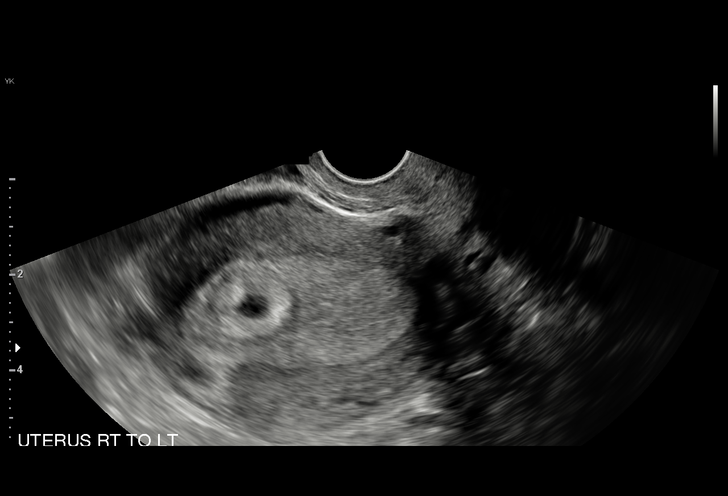
[im 8/35]
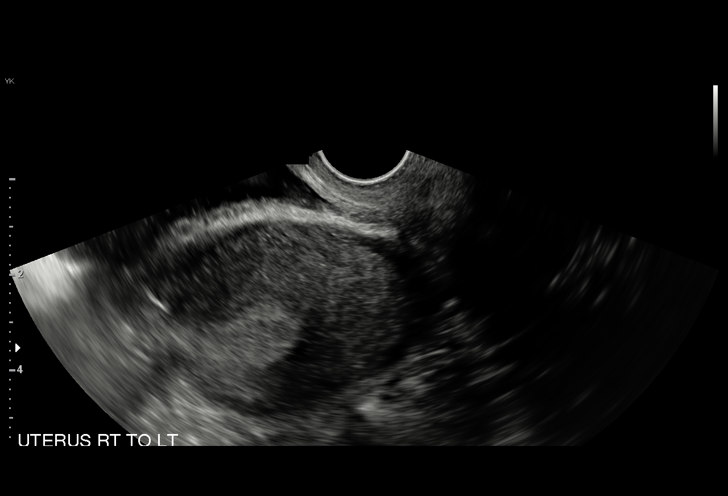
[im 11/35]
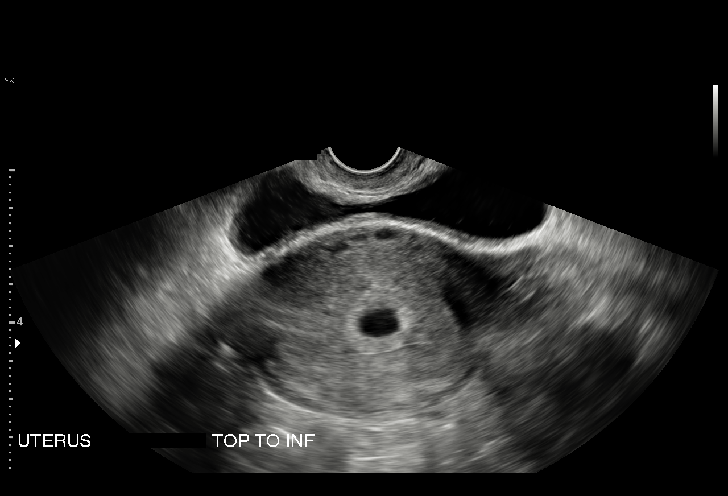
[im 13/35]
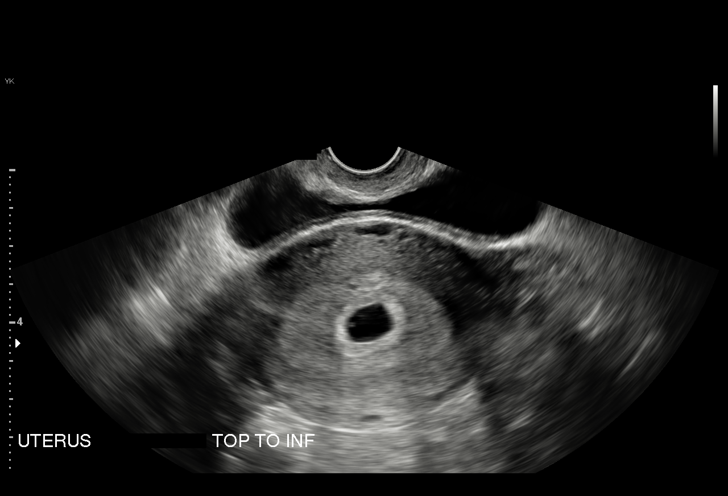
[im 16/35]
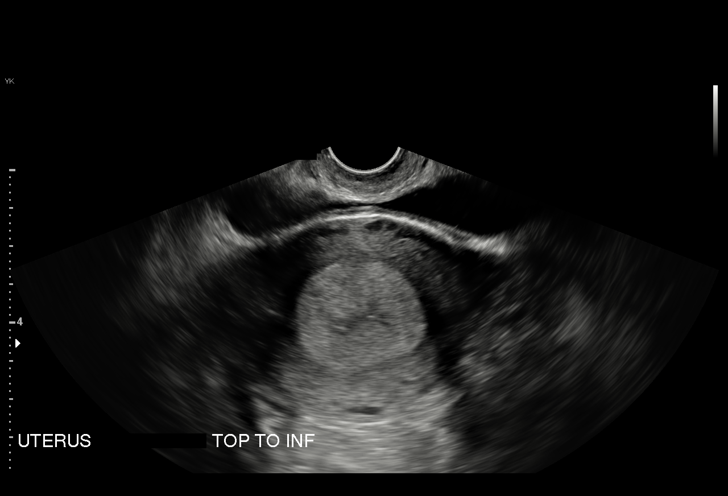
[im 18/35]
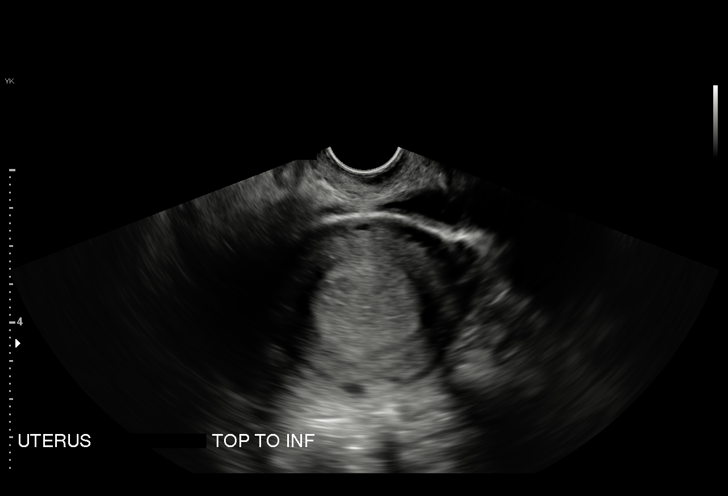
[im 19/35]
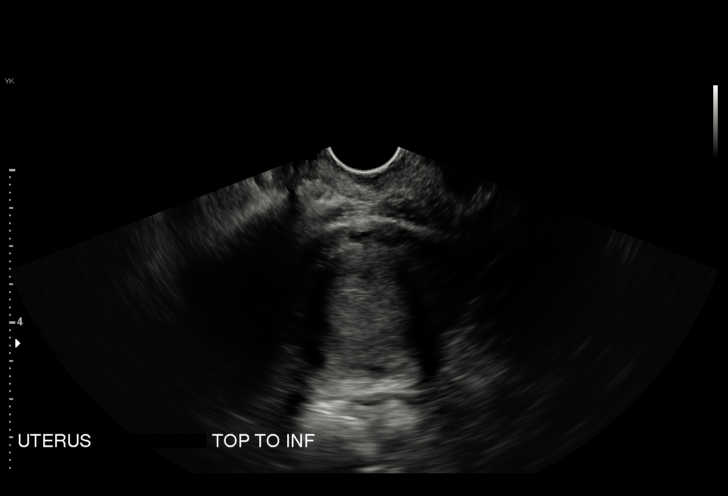
[im 22/35]
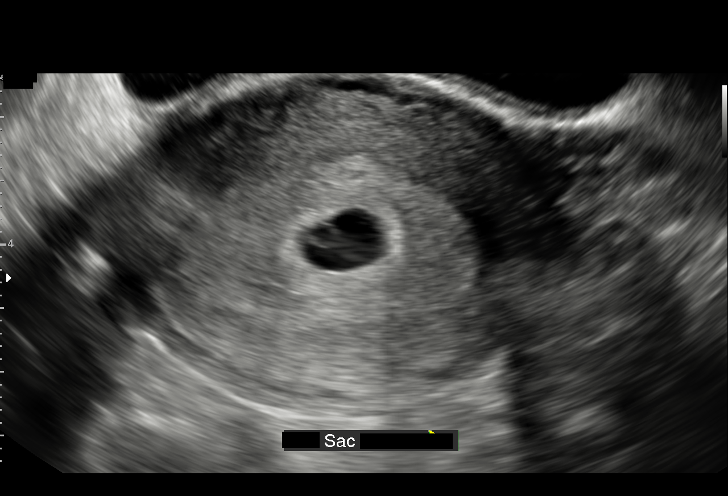
[im 24/35]
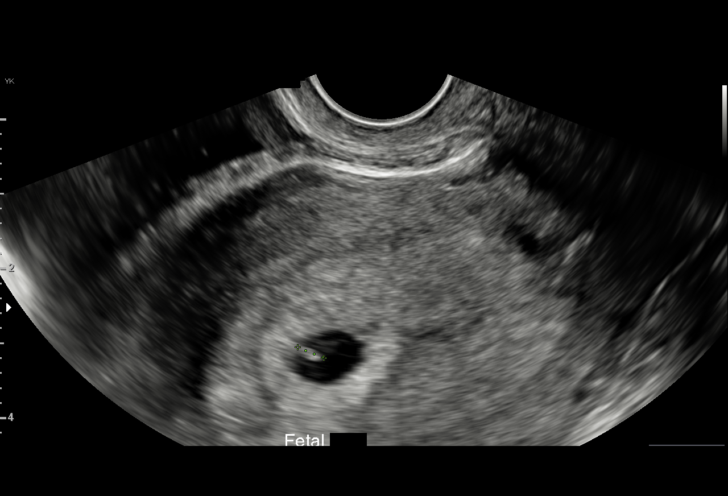
[im 27/35]
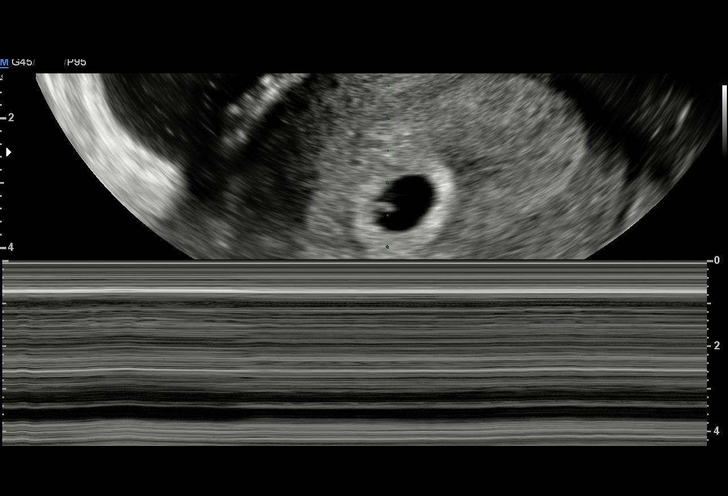
[im 29/35]
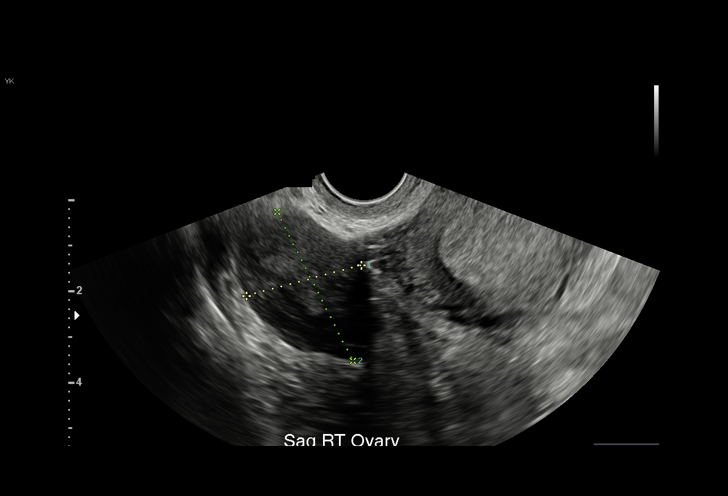
[im 32/35]
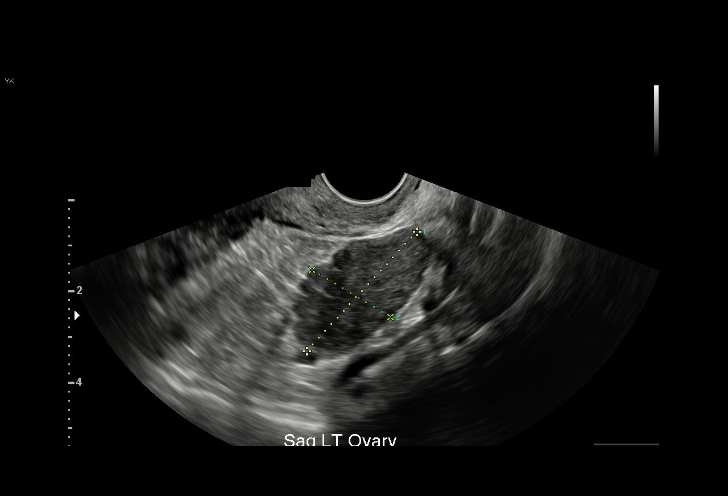
[im 35/35]
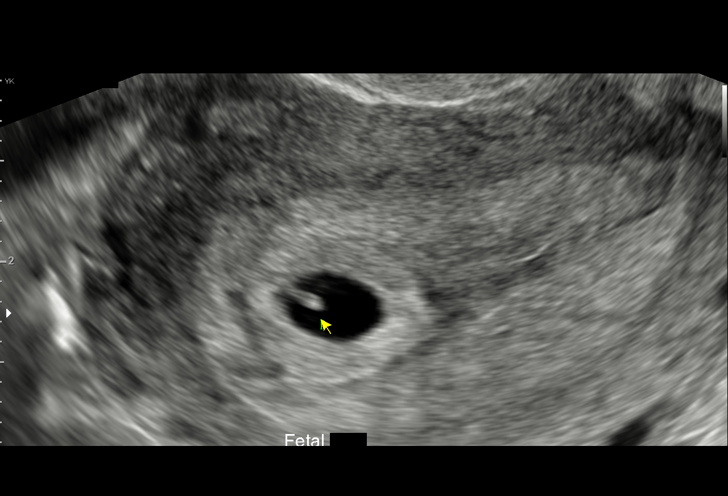

[15 of 28 positions shown; findings below may reference images not displayed]

FINDINGS: Intrauterine gestational sac: Single

Yolk sac:  Visualized.

Embryo:  Visualized.

Cardiac Activity: Not Visualized.

Heart Rate: None detected bpm

CRL:   3.79 mm   6 w 0 d                  US EDC: 06/22/2018

Subchorionic hemorrhage:  None visualized.

Maternal uterus/adnexae:

Subchorionic hemorrhage: None

Right ovary: Normal

Left ovary: Normal

Other :None

Free fluid:  None
IMPRESSION: 1. There is a single intrauterine gestational sac which contains a
yolk sac and embryo. No fetal heart rate detected. Findings are
suspicious but not yet definitive for failed pregnancy. Recommend
follow-up US in 10-14 days for definitive diagnosis. This
recommendation follows SRU consensus guidelines: Diagnostic Criteria
for Nonviable Pregnancy Early in the First Trimester. N Engl J Med

## 2020-01-26 IMAGING — US US OB TRANSVAGINAL
1 series · 16 of 27 positions shown · non-contrast
Comparison: 10/27/2017

CLINICAL DATA: Evaluate anatomic location of pregnancy.

EXAM:
TRANSVAGINAL OB ULTRASOUND
TECHNIQUE: Transvaginal ultrasound was performed for complete evaluation of the
gestation as well as the maternal uterus, adnexal regions, and
pelvic cul-de-sac.

[Series 1: us ob transvaginal · 27 acquisitions, 16 frames shown]
[im 1/27]
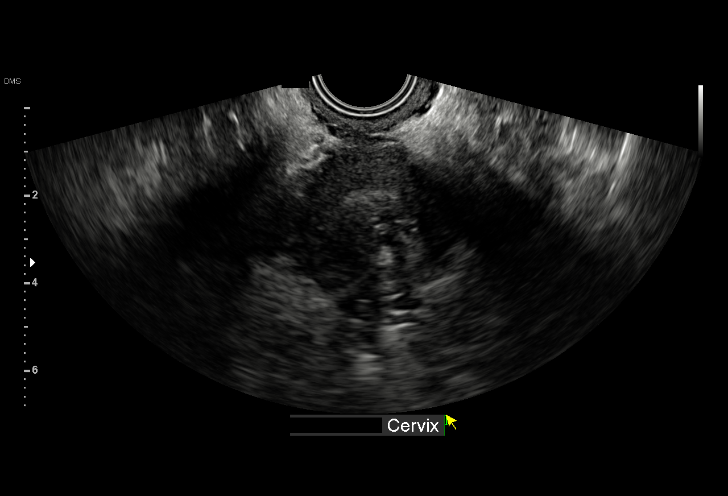
[im 3/27]
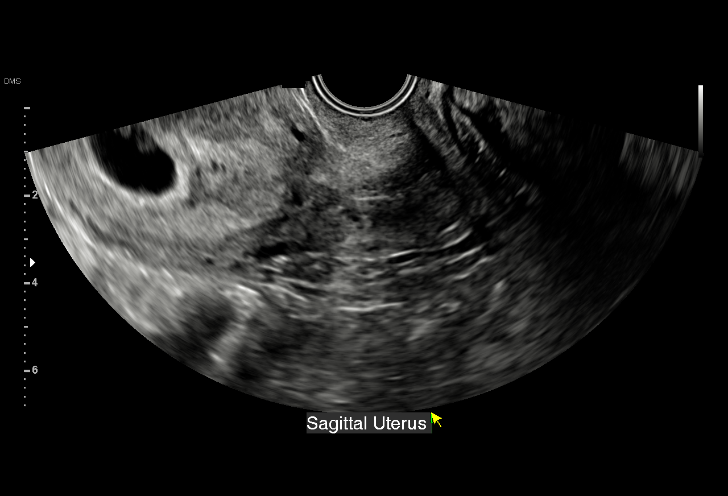
[im 5/27]
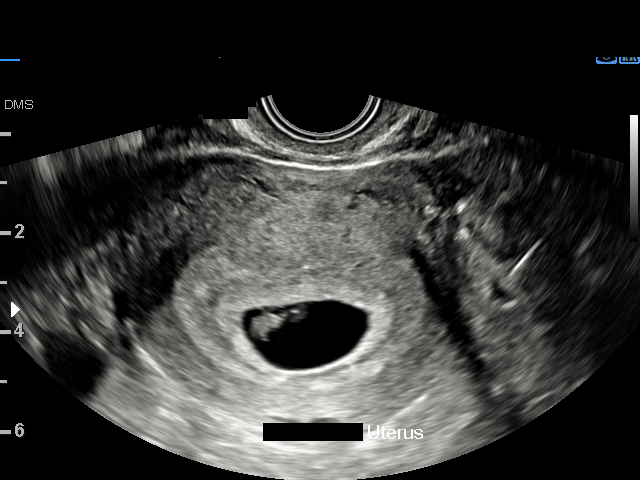
[im 6/27]
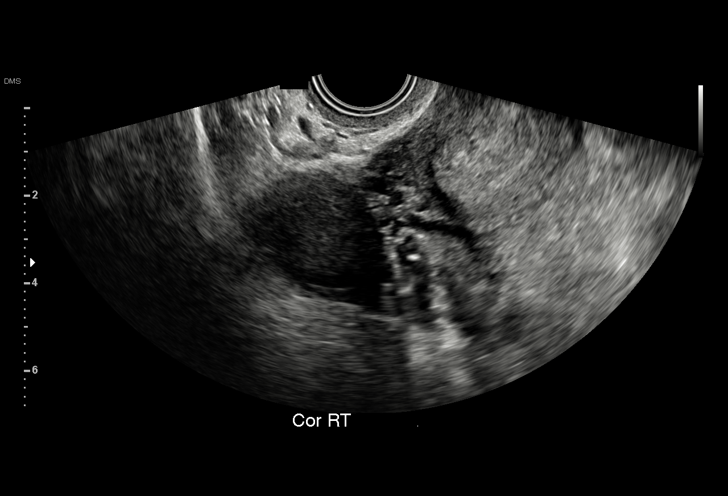
[im 8/27]
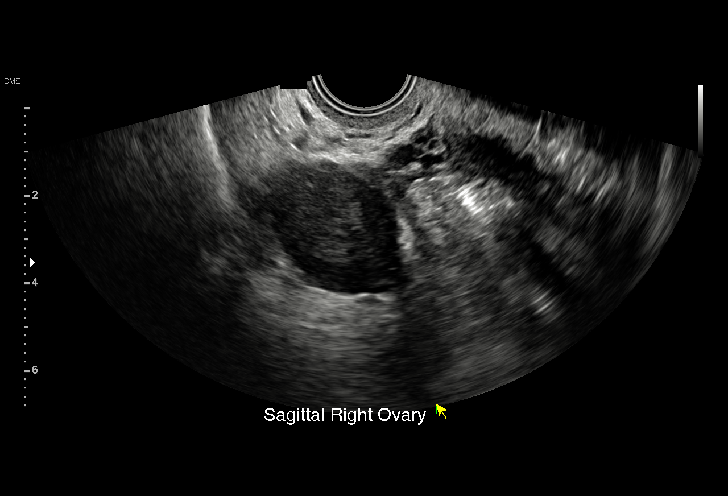
[im 10/27]
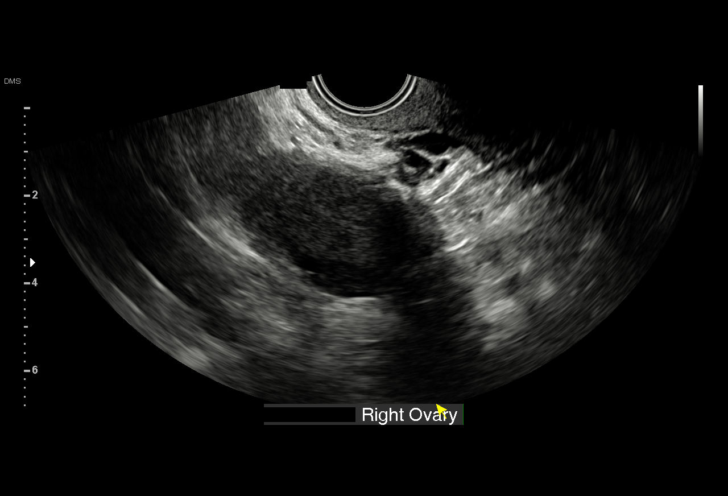
[im 11/27]
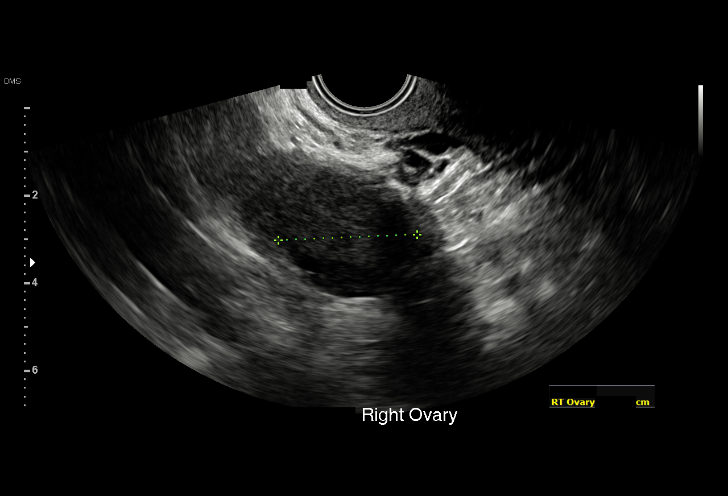
[im 13/27]
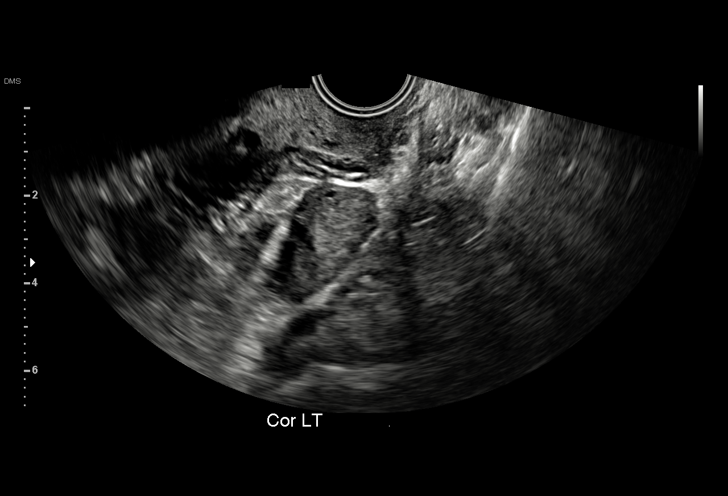
[im 15/27]
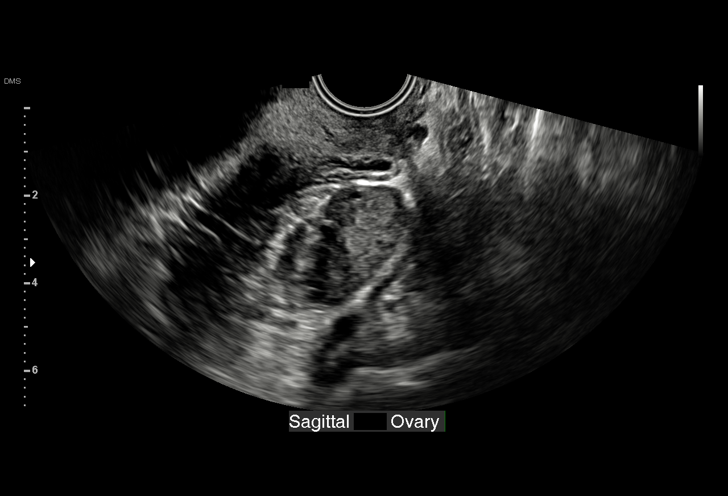
[im 17/27]
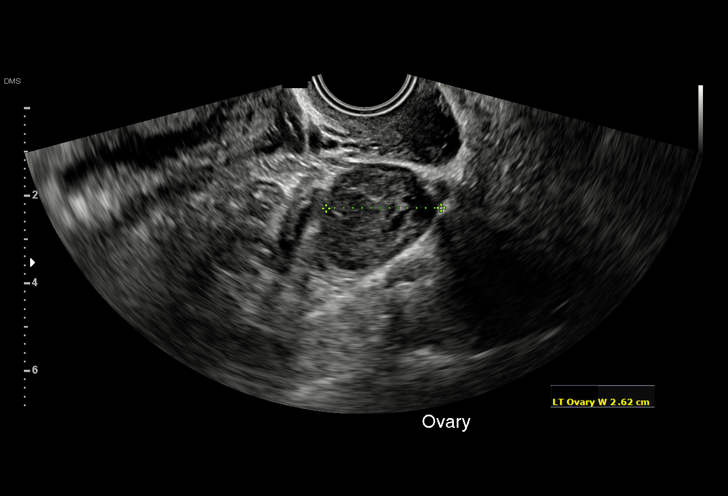
[im 18/27]
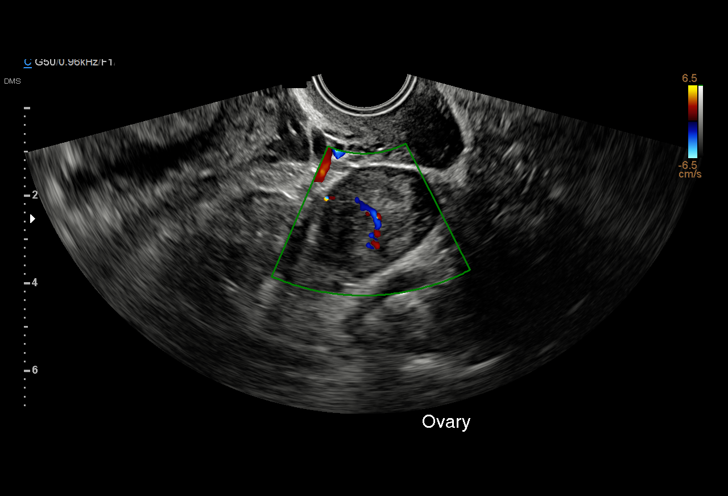
[im 20/27]
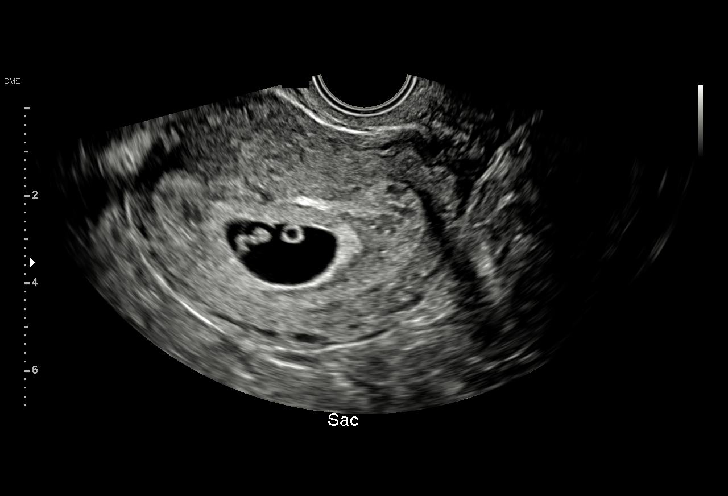
[im 22/27]
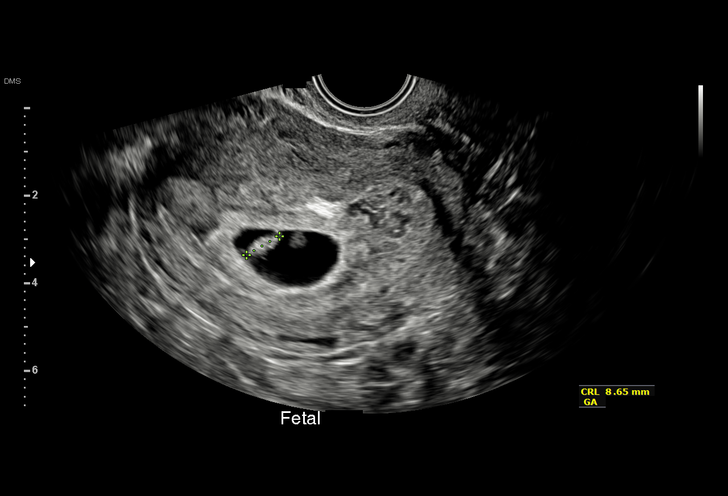
[im 23/27]
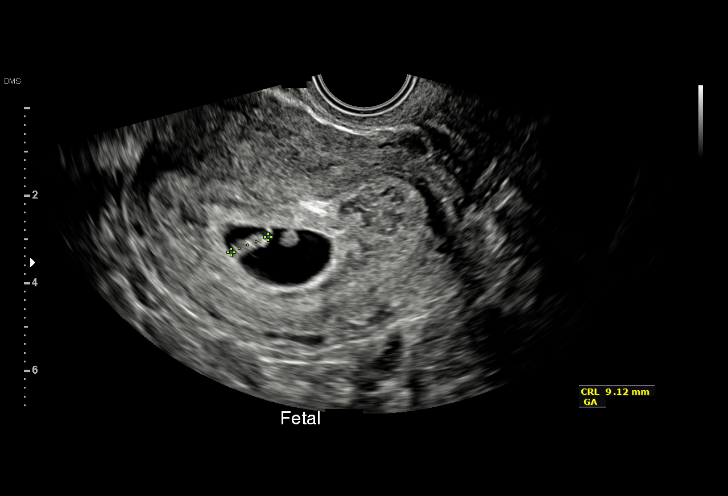
[im 25/27]
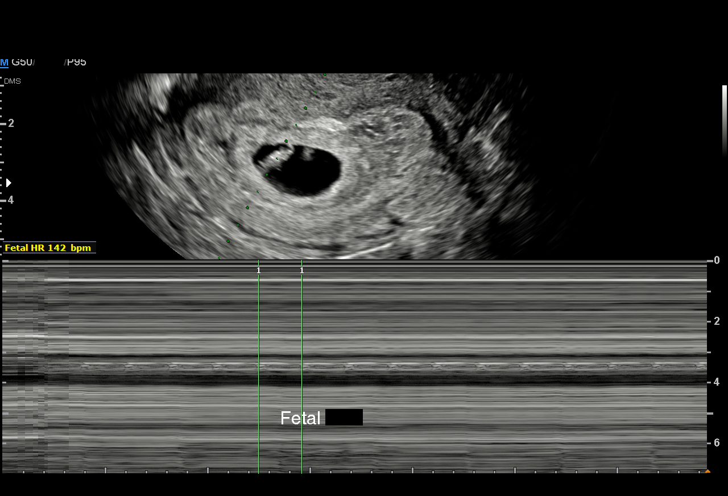
[im 27/27]
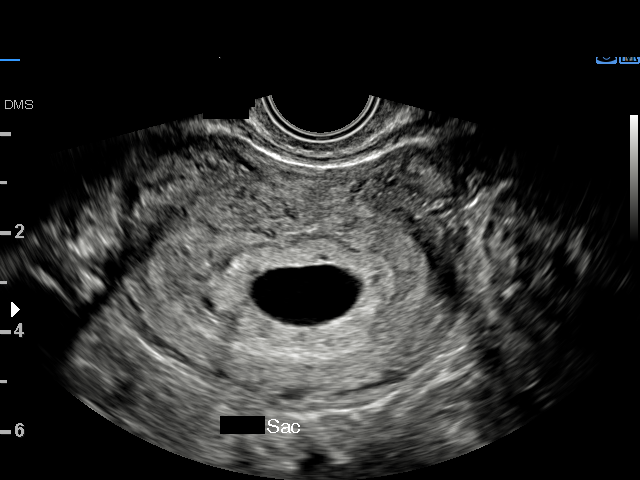

[16 of 27 positions shown; findings below may reference images not displayed]

FINDINGS: Intrauterine gestational sac: Single

Yolk sac:  Visualized.

Embryo:  Visualized.

Cardiac Activity: Visualized.

Heart Rate: 142 bpm

CRL:   9.08 mm   6 w 6 d                  US EDC: 06/26/2018

Subchorionic hemorrhage:  None visualized.

Maternal uterus/adnexae: .

Right ovary: Normal

Left ovary: Normal

Other :None

Free fluid:  None
IMPRESSION: There is a single living intrauterine gestation with an estimated
gestational age of 6 weeks and 6 days.
